# Patient Record
Sex: Male | Born: 1995 | Race: White | Hispanic: No | Marital: Single | Smoking: Never smoker
Health system: Southern US, Community
[De-identification: ages and names within clinical notes are randomized; demographics above are authoritative.]

## PROBLEM LIST (undated history)

## (undated) DIAGNOSIS — F988 Other specified behavioral and emotional disorders with onset usually occurring in childhood and adolescence: Secondary | ICD-10-CM

## (undated) DIAGNOSIS — F419 Anxiety disorder, unspecified: Secondary | ICD-10-CM

## (undated) HISTORY — PX: DENTAL SURGERY: SHX609

## (undated) HISTORY — PX: APPENDECTOMY: SHX54

## (undated) HISTORY — PX: CLEFT LIP REPAIR: SUR1164

---

## 2019-10-11 ENCOUNTER — Encounter (HOSPITAL_COMMUNITY): Payer: Self-pay | Admitting: Emergency Medicine

## 2019-10-11 ENCOUNTER — Emergency Department (HOSPITAL_COMMUNITY)
Admission: EM | Admit: 2019-10-11 | Discharge: 2019-10-11 | Disposition: A | Discharge status: Home / Self Care | Payer: No Typology Code available for payment source | Attending: Emergency Medicine | Admitting: Emergency Medicine

## 2019-10-11 ENCOUNTER — Other Ambulatory Visit: Payer: Self-pay

## 2019-10-11 DIAGNOSIS — T23262A Burn of second degree of back of left hand, initial encounter: Secondary | ICD-10-CM | POA: Diagnosis not present

## 2019-10-11 DIAGNOSIS — Y998 Other external cause status: Secondary | ICD-10-CM | POA: Diagnosis not present

## 2019-10-11 DIAGNOSIS — Y9389 Activity, other specified: Secondary | ICD-10-CM | POA: Diagnosis not present

## 2019-10-11 DIAGNOSIS — Y9289 Other specified places as the place of occurrence of the external cause: Secondary | ICD-10-CM | POA: Diagnosis not present

## 2019-10-11 DIAGNOSIS — X12XXXA Contact with other hot fluids, initial encounter: Secondary | ICD-10-CM | POA: Insufficient documentation

## 2019-10-11 DIAGNOSIS — T23002A Burn of unspecified degree of left hand, unspecified site, initial encounter: Secondary | ICD-10-CM | POA: Diagnosis present

## 2019-10-11 MED ORDER — BACITRACIN ZINC 500 UNIT/GM EX OINT: 1.0000 "application " | TOPICAL_OINTMENT | Freq: Two times a day (BID) | CUTANEOUS | 3 refills | Status: AC

## 2019-10-11 MED ORDER — HYDROCODONE-ACETAMINOPHEN 5-325 MG PO TABS
1.0000 | ORAL_TABLET | Freq: Once | ORAL | Status: AC
  Administered 2019-10-11: 1 via ORAL
  Filled 2019-10-11: qty 1

## 2019-10-11 MED ORDER — HYDROCODONE-ACETAMINOPHEN 5-325 MG PO TABS: 1.0000 | ORAL_TABLET | ORAL | 0 refills | Status: AC | PRN

## 2019-10-11 MED ORDER — BACITRACIN ZINC 500 UNIT/GM EX OINT
TOPICAL_OINTMENT | Freq: Once | CUTANEOUS | Status: AC
  Administered 2019-10-11: via TOPICAL
  Filled 2019-10-11: qty 0.9
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
## 2019-10-11 NOTE — Discharge Instructions (Signed)
Apply bacitracin twice daily to burns as directed. You can take Motrin and Tylenol for your pain as needed as directed.  If Motrin and Tylenol are not enough to control your pain, you may take Norco in place of your next Tylenol dose.  Do not drive or operate machinery if taking Norco.  Norco can cause constipation, take Colace if needed for this.  The verity of a burn will change over the first 24 hours of the injury.  He will need a 24-hour recheck on your hand.  This can be done in the emergency room tomorrow evening or in an urgent care or other Worker's [employer]. facility recommended by your employer.
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
## 2019-10-12 ENCOUNTER — Encounter (HOSPITAL_COMMUNITY): Payer: Self-pay

## 2019-10-12 ENCOUNTER — Emergency Department (HOSPITAL_COMMUNITY)
Admission: EM | Admit: 2019-10-12 | Discharge: 2019-10-12 | Disposition: A | Discharge status: Home / Self Care | Payer: No Typology Code available for payment source | Attending: Emergency Medicine | Admitting: Emergency Medicine

## 2019-10-12 ENCOUNTER — Other Ambulatory Visit: Payer: Self-pay

## 2019-10-12 DIAGNOSIS — X19XXXD Contact with other heat and hot substances, subsequent encounter: Secondary | ICD-10-CM | POA: Insufficient documentation

## 2019-10-12 DIAGNOSIS — T23222D Burn of second degree of single left finger (nail) except thumb, subsequent encounter: Secondary | ICD-10-CM | POA: Insufficient documentation

## 2019-10-12 DIAGNOSIS — T23222A Burn of second degree of single left finger (nail) except thumb, initial encounter: Secondary | ICD-10-CM

## 2019-10-12 NOTE — Discharge Instructions (Signed)
Use Tylenol and ibuprofen as needed for mild to moderate pain.  Use the narcotic pain medicine as needed for severe breakthrough pain.  Have caution, this may make you tired or groggy.  Do not drive or operate heavy machinery while taking this medicine. Change dressing daily.  When reapplying a new dressing, make sure you apply bacitracin/Vaseline to prevent the dressing from sticking.  Your blisters will likely pop on their own-- do not try to pop them.  Follow up with Dr. Marla Roe as instructed.  Return to the ER if you develop fevers, numbness, worsening pain, pus training form the wounds, or any new, worsening, or concerning symptoms.
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
## 2019-10-12 NOTE — ED Provider Notes (Signed)
[facility]-EMERGENCY DEPT Provider Note   CSN: [id number] Arrival date & time: 10/12/19  1519     History   Chief Complaint Chief Complaint  Patient presents with  . Follow-up    HPI James Lamb is a 23 y.o. male presenting for recheck of hand burn.  Patient states he was seen yesterday for a burn to the dorsal aspect of his left hand.  Patient states he has not had significant pain.  He states his fingers feel swollen, but no numbness or tingling.  No fevers or chills.  He has not unwrapped the dressing.  He tried to follow-up with Worker's [employer]., however the office is closed on the weekend.  He is planning on following up with plastic surgery as recommended on Monday.  He is not immunocompromised.  Tetanus is up-to-date.  Additional history obtained from chart review.  Reviewed visit and images from initial evaluation of hand burn.     HPI  History reviewed. No pertinent past medical history.  There are no active problems to display for this patient.   Past Surgical History:  Procedure Laterality Date  . APPENDECTOMY    . CLEFT LIP REPAIR    . DENTAL SURGERY          Home Medications    Prior to Admission medications   Medication Sig Start Date End Date Taking? Authorizing Provider  bacitracin ointment Apply 1 application topically 2 (two) times daily. 10/11/19   Jeannie Fend, PA-C  HYDROcodone-acetaminophen (NORCO/VICODIN) 5-325 MG tablet Take 1 tablet by mouth every 4 (four) hours as needed. 10/11/19   Jeannie Fend, PA-C    Family History History reviewed. No pertinent family history.  Social History Social History   Tobacco Use  . Smoking status: Never Smoker  . Smokeless tobacco: Never Used  Substance Use Topics  . Alcohol use: Never    Frequency: Never  . Drug use: Never     Allergies   Patient has no known allergies.   Review of Systems Review of Systems  Constitutional: Negative for fever.  Skin:   Burn to the left hand  Neurological: Negative for numbness.     Physical Exam Updated Vital Signs BP 139/63 (BP Location: Right Arm)   Pulse 98   Temp 98.6 F (37 C) (Oral)   Resp 18   Ht 5\' 8"  (1.727 m)   Wt 93 kg   SpO2 99%   BMI 31.17 kg/m   Physical Exam Vitals signs and nursing note reviewed.  Constitutional:      General: He is not in acute distress.    Appearance: He is well-developed.     Comments: Appears nontoxic  HENT:     Head: Normocephalic and atraumatic.  Neck:     Musculoskeletal: Normal range of motion.  Pulmonary:     Effort: Pulmonary effort is normal.  Abdominal:     General: There is no distension.  Musculoskeletal: Normal range of motion.     Comments: Good distal sensation and cap refill of all fingers.  Skin:    General: Skin is warm.     Capillary Refill: Capillary refill takes less than 2 seconds.     Findings: No rash.     Comments: See picture below.  Blisters noted on dorsal aspect of multiple fingers of the left hand.  No purulent drainage.  No signs of infection.  Neurological:     Mental Status: He is alert and oriented to person, place, and  time.          ED Treatments / Results  Labs (all labs ordered are listed, but only abnormal results are displayed) Labs Reviewed - No data to display  EKG None  Radiology No results found.  Procedures Procedures (including critical care time)  Medications Ordered in ED Medications - No data to display   Initial Impression / Assessment and Plan / ED Course  I have reviewed the triage vital signs and the nursing notes.  Pertinent labs & imaging results that were available during my care of the patient were reviewed by me and considered in my medical decision making (see chart for details).        Patient presenting for evaluation of recheck of burns.  Physical exam reassuring, he appears nontoxic.  No signs of infection at this time.  Patient without significant pain.  Case  discussed with attending, Dr. Francia Greaves agrees to plan.  Will rewrap with nonstick dressing.  Discussed use of bacitracin to prevent sticking of the dressing.  Discussed follow-up with plastic surgery for further management of the wounds as the skin heals.  Discussed continued monitoring for signs of infection.  At this time, patient appears safe for discharge.  Return precautions given.  Patient states he understands and agrees to plan.  Final Clinical Impressions(s) / ED Diagnoses   Final diagnoses:  Partial thickness burn of finger of left hand, initial encounter    ED Discharge Orders    None       Franchot Heidelberg, PA-C 10/12/19 1641    Valarie Merino, MD 10/12/19 2137

## 2019-10-12 NOTE — ED Notes (Signed)
DSG INTACT TO LEFT HAND. SUPPLIES GIVEN . PT UNDERSTANDS DRSG CHANGE. NO RX GIVEN

## 2020-05-18 ENCOUNTER — Emergency Department (HOSPITAL_BASED_OUTPATIENT_CLINIC_OR_DEPARTMENT_OTHER): Payer: Commercial Managed Care - PPO

## 2020-05-18 ENCOUNTER — Emergency Department (HOSPITAL_BASED_OUTPATIENT_CLINIC_OR_DEPARTMENT_OTHER)
Admission: EM | Admit: 2020-05-18 | Discharge: 2020-05-18 | Disposition: A | Discharge status: Home / Self Care | Payer: Commercial Managed Care - PPO | Attending: Emergency Medicine | Admitting: Emergency Medicine

## 2020-05-18 ENCOUNTER — Other Ambulatory Visit: Payer: Self-pay

## 2020-05-18 ENCOUNTER — Encounter (HOSPITAL_BASED_OUTPATIENT_CLINIC_OR_DEPARTMENT_OTHER): Payer: Self-pay | Admitting: Emergency Medicine

## 2020-05-18 DIAGNOSIS — R1011 Right upper quadrant pain: Secondary | ICD-10-CM | POA: Insufficient documentation

## 2020-05-18 DIAGNOSIS — Z79899 Other long term (current) drug therapy: Secondary | ICD-10-CM | POA: Diagnosis not present

## 2020-05-18 DIAGNOSIS — F909 Attention-deficit hyperactivity disorder, unspecified type: Secondary | ICD-10-CM | POA: Diagnosis not present

## 2020-05-18 HISTORY — DX: Anxiety disorder, unspecified: F41.9

## 2020-05-18 HISTORY — DX: Other specified behavioral and emotional disorders with onset usually occurring in childhood and adolescence: F98.8

## 2020-05-18 LAB — URINALYSIS, ROUTINE W REFLEX MICROSCOPIC
Bilirubin Urine: NEGATIVE
Glucose, UA: NEGATIVE mg/dL
Hgb urine dipstick: NEGATIVE
Ketones, ur: NEGATIVE mg/dL
Leukocytes,Ua: NEGATIVE
Nitrite: NEGATIVE
Protein, ur: NEGATIVE mg/dL
Specific Gravity, Urine: 1.025 (ref 1.005–1.030)
pH: 6 (ref 5.0–8.0)

## 2020-05-18 LAB — CBC WITH DIFFERENTIAL/PLATELET
Abs Immature Granulocytes: 0.06 10*3/uL (ref 0.00–0.07)
Basophils Absolute: 0 10*3/uL (ref 0.0–0.1)
Basophils Relative: 0 %
Eosinophils Absolute: 0.1 10*3/uL (ref 0.0–0.5)
Eosinophils Relative: 1 %
HCT: 43.9 % (ref 39.0–52.0)
Hemoglobin: 14.9 g/dL (ref 13.0–17.0)
Immature Granulocytes: 1 %
Lymphocytes Relative: 11 %
Lymphs Abs: 1.1 10*3/uL (ref 0.7–4.0)
MCH: 28.9 pg (ref 26.0–34.0)
MCHC: 33.9 g/dL (ref 30.0–36.0)
MCV: 85.2 fL (ref 80.0–100.0)
Monocytes Absolute: 0.6 10*3/uL (ref 0.1–1.0)
Monocytes Relative: 6 %
Neutro Abs: 8.4 10*3/uL — ABNORMAL HIGH (ref 1.7–7.7)
Neutrophils Relative %: 81 %
Platelets: 214 10*3/uL (ref 150–400)
RBC: 5.15 MIL/uL (ref 4.22–5.81)
RDW: 12.2 % (ref 11.5–15.5)
WBC: 10.3 10*3/uL (ref 4.0–10.5)
nRBC: 0 % (ref 0.0–0.2)

## 2020-05-18 LAB — COMPREHENSIVE METABOLIC PANEL
ALT: 34 U/L (ref 0–44)
AST: 25 U/L (ref 15–41)
Albumin: 4.5 g/dL (ref 3.5–5.0)
Alkaline Phosphatase: 86 U/L (ref 38–126)
Anion gap: 9 (ref 5–15)
BUN: 11 mg/dL (ref 6–20)
CO2: 26 mmol/L (ref 22–32)
Calcium: 8.9 mg/dL (ref 8.9–10.3)
Chloride: 102 mmol/L (ref 98–111)
Creatinine, Ser: 0.86 mg/dL (ref 0.61–1.24)
GFR calc Af Amer: >60 mL/min (ref >60)
GFR calc non Af Amer: >60 mL/min (ref >60)
Glucose, Bld: 125 mg/dL — ABNORMAL HIGH (ref 70–99)
Potassium: 3.5 mmol/L (ref 3.5–5.1)
Sodium: 137 mmol/L (ref 135–145)
Total Bilirubin: 0.8 mg/dL (ref 0.3–1.2)
Total Protein: 7.4 g/dL (ref 6.5–8.1)

## 2020-05-18 LAB — LIPASE, BLOOD: Lipase: 21 U/L (ref 11–51)

## 2020-05-18 MED ORDER — KETOROLAC TROMETHAMINE 30 MG/ML IJ SOLN
30.0000 mg | Freq: Once | INTRAMUSCULAR | Status: AC
  Administered 2020-05-18: 30 mg via INTRAVENOUS
  Filled 2020-05-18: qty 1

## 2020-05-18 MED ORDER — IBUPROFEN 600 MG PO TABS: 600.0000 mg | ORAL_TABLET | Freq: Four times a day (QID) | ORAL | 0 refills | Status: AC | PRN

## 2020-05-18 MED ORDER — ACETAMINOPHEN 500 MG PO TABS: 500.0000 mg | ORAL_TABLET | Freq: Four times a day (QID) | ORAL | 0 refills | Status: AC | PRN

## 2020-05-18 MED ORDER — IOHEXOL 300 MG/ML  SOLN
100.0000 mL | Freq: Once | INTRAMUSCULAR | Status: AC | PRN
  Administered 2020-05-18: 100 mL via INTRAVENOUS

## 2020-05-18 MED ORDER — MORPHINE SULFATE (PF) 4 MG/ML IV SOLN
4.0000 mg | Freq: Once | INTRAVENOUS | Status: AC
  Administered 2020-05-18: 4 mg via INTRAVENOUS
  Filled 2020-05-18: qty 1

## 2020-05-18 NOTE — ED Notes (Signed)
PA at bedside for IV start/blood draw.

## 2020-05-18 NOTE — ED Provider Notes (Signed)
[facility] EMERGENCY DEPARTMENT Provider Note   CSN: [id number] Arrival date & time: 05/18/20  1618     History Chief Complaint  Patient presents with  . Abdominal Pain    James Lamb is a 24 y.o. male with history of ADD, anxiety presents for evaluation of acute onset, progressively worsening right upper quadrant abdominal pain.  Reports that symptoms began yesterday and were more mild and "felt like a stomachache".  He took Tums and ibuprofen and Tylenol with no change in his symptoms.  When he awoke this morning he reports the pain was persistent and so he took Gas-X with no change in his symptoms.  Approximately 1 to 2 hours ago reports the pain became very severe sharp and stabbing.  He went to urgent care and was sent here for further evaluation and management.  The pain radiates to the back a little.  He denies chest pain, shortness of breath, nausea, vomiting, diarrhea, constipation, or urinary symptoms.  No known sick contacts.  The pain worsens with deep inspiration and sitting upright.  He is status post appendectomy when he was around 24 years old in [place].  He denies recent travel or surgeries, mopped assist, prior history of DVT or PE.  The history is provided by the patient.       Past Medical History:  Diagnosis Date  . ADD (attention deficit disorder)   . Anxiety     There are no problems to display for this patient.   Past Surgical History:  Procedure Laterality Date  . APPENDECTOMY    . CLEFT LIP REPAIR    . DENTAL SURGERY         No family history on file.  Social History   Tobacco Use  . Smoking status: Never Smoker  . Smokeless tobacco: Never Used  Vaping Use  . Vaping Use: Never used  Substance Use Topics  . Alcohol use: Yes  . Drug use: Never    Home Medications Prior to Admission medications   Medication Sig Start Date End Date Taking? Authorizing Provider  guanFACINE (INTUNIV) 1 MG TB24 ER tablet  05/08/20  Yes  [provider]  lisdexamfetamine (VYVANSE) 30 MG capsule Take by mouth. 03/05/20  Yes [provider]  sertraline (ZOLOFT) 25 MG tablet Take by mouth. 03/05/20  Yes [provider]  acetaminophen (TYLENOL) 500 MG tablet Take 1 tablet (500 mg total) by mouth every 6 (six) hours as needed. 05/18/20   Rosanne Wohlfarth A, PA-C  bacitracin ointment Apply 1 application topically 2 (two) times daily. 10/11/19   Tacy Learn, PA-C  HYDROcodone-acetaminophen (NORCO/VICODIN) 5-325 MG tablet Take 1 tablet by mouth every 4 (four) hours as needed. 10/11/19   Tacy Learn, PA-C  ibuprofen (ADVIL) 600 MG tablet Take 1 tablet (600 mg total) by mouth every 6 (six) hours as needed. 05/18/20   Rodell Perna A, PA-C    Allergies    Patient has no known allergies.  Review of Systems   Review of Systems  Constitutional: Negative for chills and fever.  Respiratory: Negative for shortness of breath.   Cardiovascular: Negative for chest pain.  Gastrointestinal: Positive for abdominal pain. Negative for constipation, diarrhea, nausea and vomiting.  Genitourinary: Negative for dysuria, frequency, hematuria and urgency.  All other systems reviewed and are negative.   Physical Exam Updated Vital Signs BP 132/78 (BP Location: Right Arm)   Pulse 80   Temp 98.9 F (37.2 C) (Oral)   Resp  18   Ht 5\' 8"  (1.727 m)   Wt 89.8 kg   SpO2 99%   BMI 30.11 kg/m   Physical Exam Vitals and nursing note reviewed.  Constitutional:      General: He is not in acute distress.    Appearance: He is well-developed.  HENT:     Head: Normocephalic and atraumatic.  Eyes:     General:        Right eye: No discharge.        Left eye: No discharge.     Conjunctiva/sclera: Conjunctivae normal.  Neck:     Vascular: No JVD.     Trachea: No tracheal deviation.  Cardiovascular:     Rate and Rhythm: Normal rate and regular rhythm.  Pulmonary:     Effort: Pulmonary effort is normal.     Breath sounds:  Normal breath sounds.  Chest:     Chest wall: No tenderness.  Abdominal:     General: There is no distension.     Palpations: Abdomen is soft.     Tenderness: There is abdominal tenderness in the right upper quadrant and right lower quadrant. There is no right CVA tenderness, left CVA tenderness, guarding or rebound. Positive signs include Murphy's sign and McBurney's sign. Negative signs include Rovsing's sign, psoas sign and obturator sign.  Skin:    Findings: No erythema.  Neurological:     Mental Status: He is alert.  Psychiatric:        Behavior: Behavior normal.     ED Results / Procedures / Treatments   Labs (all labs ordered are listed, but only abnormal results are displayed) Labs Reviewed  CBC WITH DIFFERENTIAL/PLATELET - Abnormal; Notable for the following components:      Result Value   Neutro Abs 8.4 (*)    All other components within normal limits  COMPREHENSIVE METABOLIC PANEL - Abnormal; Notable for the following components:   Glucose, Bld 125 (*)    All other components within normal limits  LIPASE, BLOOD  URINALYSIS, ROUTINE W REFLEX MICROSCOPIC    EKG None  Radiology CT ABDOMEN PELVIS W CONTRAST  Result Date: 05/18/2020 CLINICAL DATA:  Right upper quadrant pain EXAM: CT ABDOMEN AND PELVIS WITH CONTRAST TECHNIQUE: Multidetector CT imaging of the abdomen and pelvis was performed using the standard protocol following bolus administration of intravenous contrast. CONTRAST:  05/20/2020 OMNIPAQUE IOHEXOL 300 MG/ML  SOLN COMPARISON:  None. FINDINGS: Lower chest: No acute abnormality. Hepatobiliary: No focal liver abnormality is seen. No gallstones, gallbladder wall thickening, or biliary dilatation. Pancreas: Unremarkable. No pancreatic ductal dilatation or surrounding inflammatory changes. Spleen: Normal in size without focal abnormality. Adrenals/Urinary Tract: Adrenal glands are unremarkable. Kidneys are normal, without renal calculi, focal lesion, or hydronephrosis.  Bladder is unremarkable. Stomach/Bowel: Stomach is within normal limits. History of appendectomy. No evidence of bowel wall thickening, distention, or inflammatory changes. Vascular/Lymphatic: No significant vascular findings are present. No enlarged abdominal or pelvic lymph nodes. Reproductive: Prostate is unremarkable. Other: Mild fat stranding within the right upper quadrant mesenteric fat. This is adjacent to the hepatic flexure/distal ascending colon. Musculoskeletal: No acute or significant osseous findings. IMPRESSION: 1. Mild fat stranding within the right upper quadrant mesenteric fat adjacent to the hepatic flexure/distal ascending colon. Findings are nonspecific but consider epiploic appendagitis. 2. Otherwise no CT evidence for acute intra-abdominal or pelvic abnormality. Electronically Signed   By: M.D.   On: 05/18/2020 18:08    Procedures Procedures (including critical care time)  Medications Ordered  in ED Medications  morphine 4 MG/ML injection 4 mg (4 mg Intravenous Given 05/18/20 1710)  iohexol (OMNIPAQUE) 300 MG/ML solution 100 mL (100 mLs Intravenous Contrast Given 05/18/20 1741)  ketorolac (TORADOL) 30 MG/ML injection 30 mg (30 mg Intravenous Given 05/18/20 1837)    ED Course  I have reviewed the triage vital signs and the nursing notes.  Pertinent labs & imaging results that were available during my care of the patient were reviewed by me and considered in my medical decision making (see chart for details).    MDM Rules/Calculators/A&P                          Patient presenting for evaluation of progressively worsening right upper abdominal pain since yesterday.  No other associated symptoms.  He is afebrile, vital signs are stable.  He is nontoxic in appearance.  He is right-sided abdominal pain on examination but no rebound or guarding.  Sent from urgent care for further evaluation.  Lab work reviewed and interpreted by myself shows no leukocytosis, no  anemia, no metabolic derangements or renal insufficiency.  LFTs and lipase are within normal limits.  UA does not show any evidence of UTI or nephrolithiasis.  Imaging today shows mild fat stranding within the right upper quadrant mesenteric fat adjacent to the hepatic flexure/distal ascending colon concerning for possible epiploic appendagitis.  Otherwise no evidence of acute surgical abdominal pathology including obstruction, perforation, cholecystitis, diverticulitis, or nephrolithiasis.  On reevaluation patient is resting comfortably in no apparent distress.  Reports his pain has improved.  Serial abdominal examinations remain benign.  He has been tolerating p.o. food and fluids at home without difficulty.  Discussed findings.  Recommend starting anti-inflammatories, Tylenol, rest.  Discussed signs and symptoms to suggest GI upset from NSAIDs.  Recommend follow-up with PCP for reevaluation of symptoms.  Discussed strict ED return precautions.  Patient and mother verbalized understanding of and agreement with plan and patient is stable for discharge at this time.   Final Clinical Impression(s) / ED Diagnoses Final diagnoses:  Right upper quadrant abdominal pain    Rx / DC Orders ED Discharge Orders         Ordered    ibuprofen (ADVIL) 600 MG tablet  Every 6 hours PRN     Discontinue  Reprint     05/18/20 1851    acetaminophen (TYLENOL) 500 MG tablet  Every 6 hours PRN     Discontinue  Reprint     05/18/20 1851           Jeanie Sewer, PA-C 05/18/20 1855    Vanetta Mulders, MD 05/18/20 1857

## 2020-05-18 NOTE — ED Notes (Signed)
ED Provider at bedside. 

## 2020-05-18 NOTE — ED Triage Notes (Signed)
RLQ abd pain for 2 days, denies n/v/d. Last BM today.

## 2020-05-18 NOTE — Discharge Instructions (Signed)
1. Medications: Alternate 600 mg of ibuprofen and [phone] mg of Tylenol every 3 hours as needed for pain. Do not exceed 4000 mg of Tylenol daily.  Take ibuprofen with food to avoid upset stomach issues.  Stop taking ibuprofen if you develop any upset stomach issues such as vomiting blood, upper abdominal pain mostly on the left side, blood in stool. 2. Treatment: rest, drink plenty of fluids.  3. Follow Up: Please followup with your primary doctor in 3 days for discussion of your diagnoses and further evaluation after today's visit; if you do not have a primary care doctor use the resource guide provided to find one; Please return to the ER for persistent vomiting, high fevers or worsening symptoms

## 2021-11-28 IMAGING — CT CT ABD-PELV W/ CM
2 of 4 series · 16 of 46 positions shown, 18 images · IV contrast (Omnipaque)
Comparison: None.

CLINICAL DATA: Right upper quadrant pain

EXAM:
CT ABDOMEN AND PELVIS WITH CONTRAST
TECHNIQUE: Multidetector CT imaging of the abdomen and pelvis was performed
using the standard protocol following bolus administration of
intravenous contrast.
CONTRAST:  100mL OMNIPAQUE IOHEXOL 300 MG/ML  SOLN

[Series 2: axial st · axial · 0.78mm/px · z∈[-669,-204]mm · 13 of 103 slices shown, 15 images]
[im 5/103  soft-tissue]
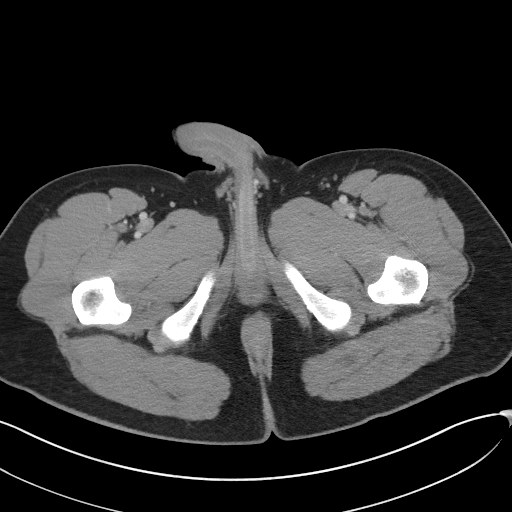
[im 5/103  bone]
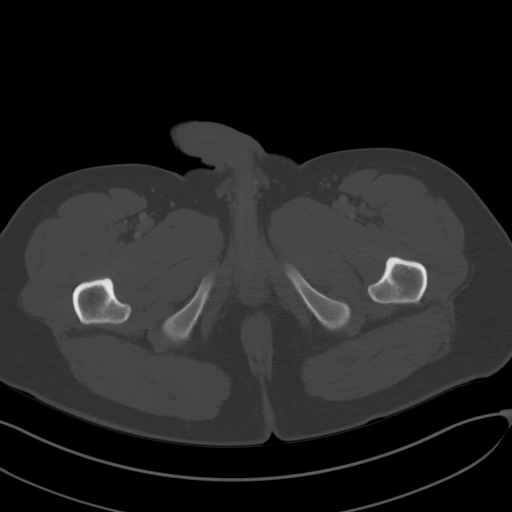
[im 13/103  soft-tissue]
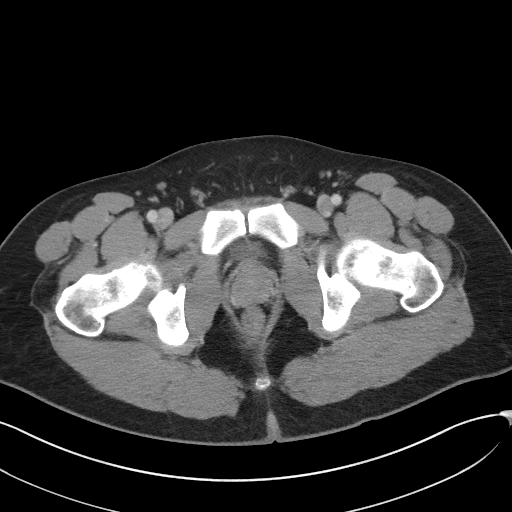
[im 21/103  soft-tissue]
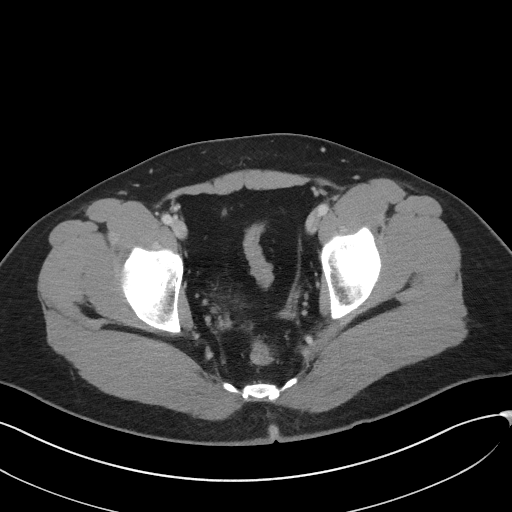
[im 29/103  soft-tissue]
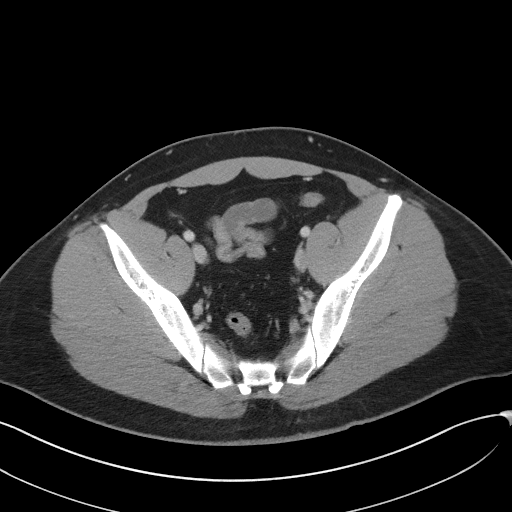
[im 37/103  soft-tissue]
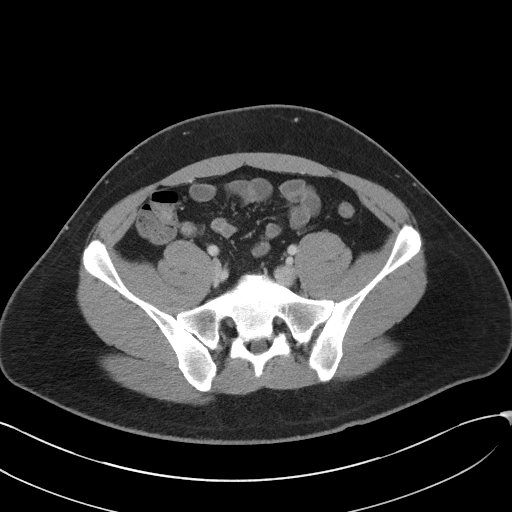
[im 45/103  soft-tissue]
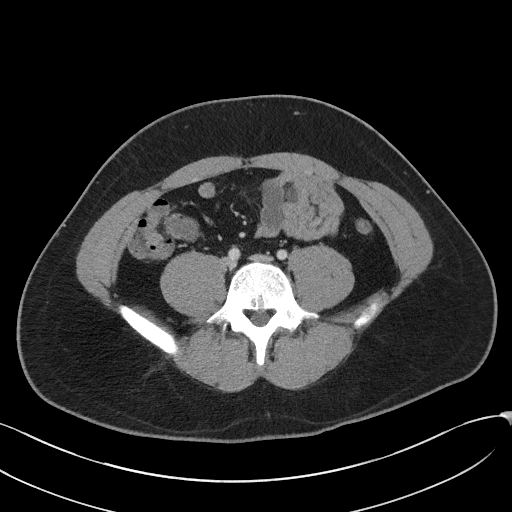
[im 54/103  soft-tissue]
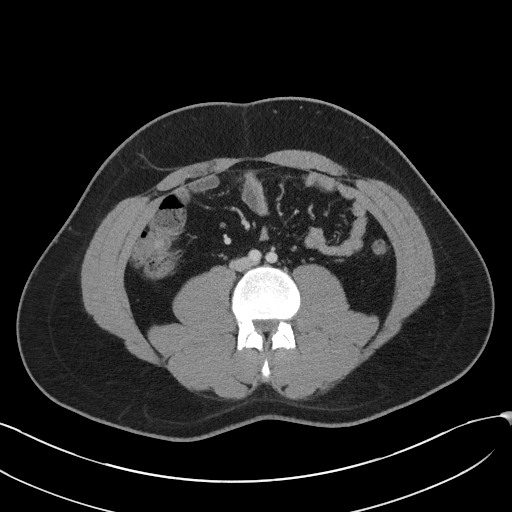
[im 58/103  soft-tissue]
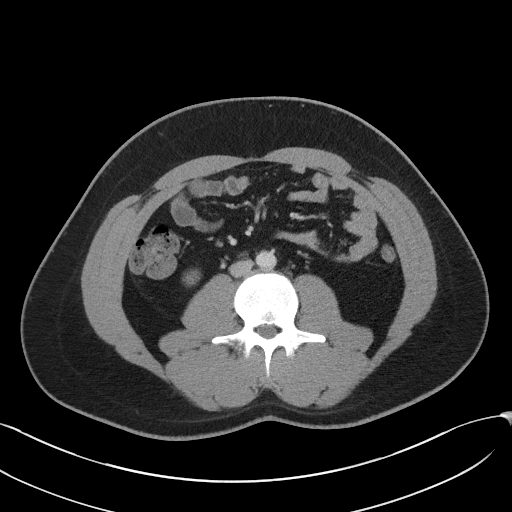
[im 66/103  soft-tissue]
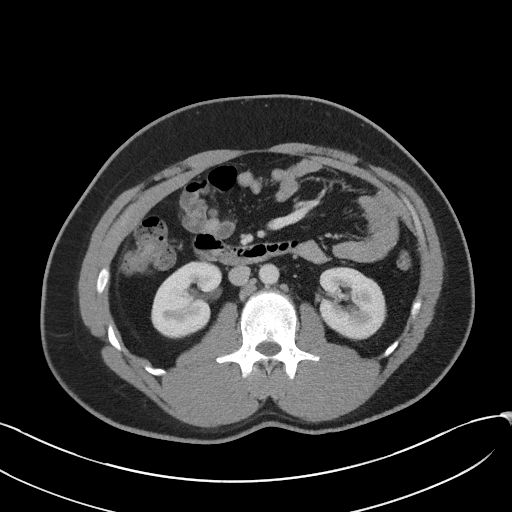
[im 66/103  bone]
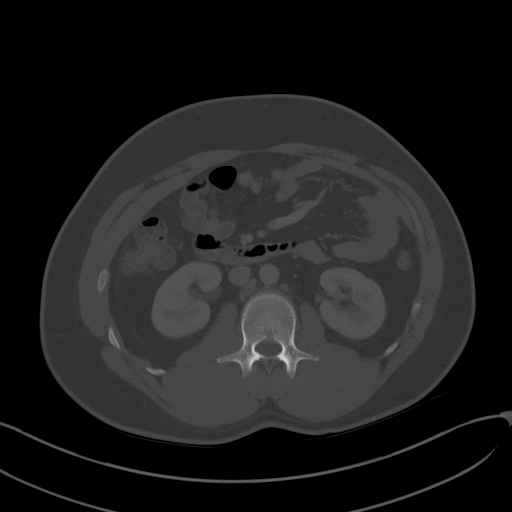
[im 74/103  soft-tissue]
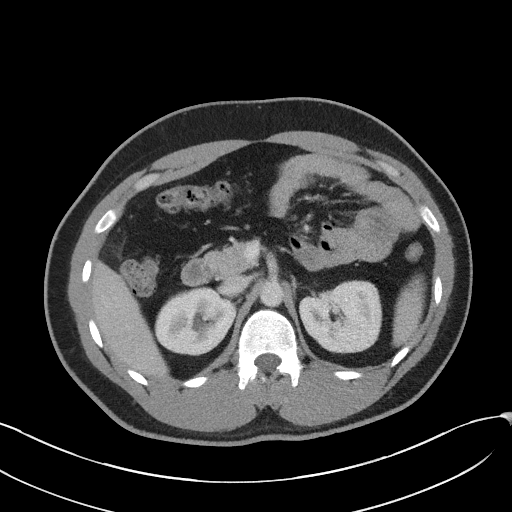
[im 82/103  soft-tissue]
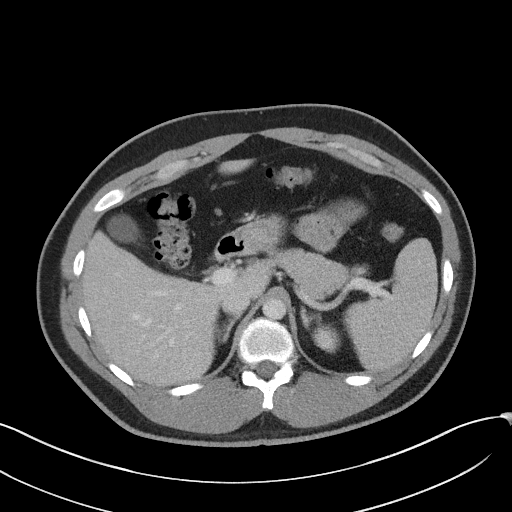
[im 90/103  soft-tissue]
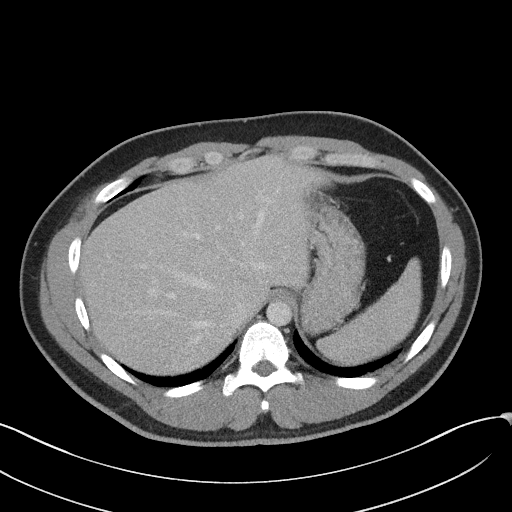
[im 98/103  soft-tissue]
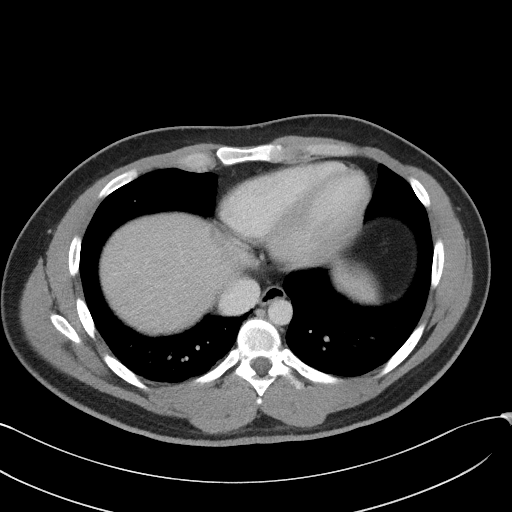

[Series 5: coronal st · coronal · 0.77mm/px · 3 of 101 slices shown]
[im 34/101  soft-tissue]
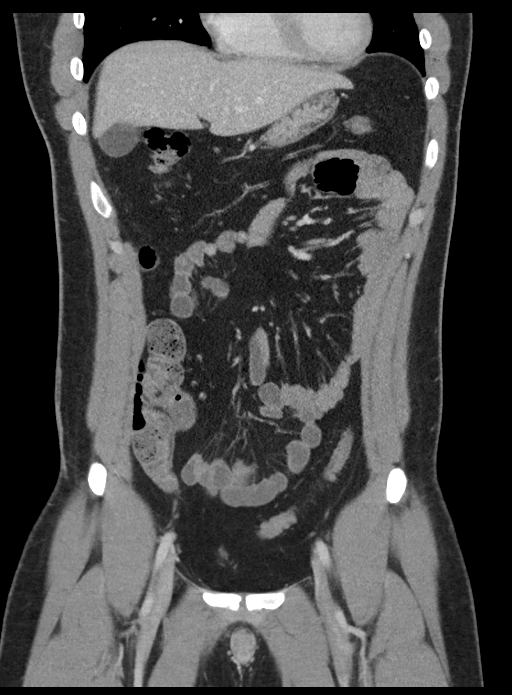
[im 45/101  soft-tissue]
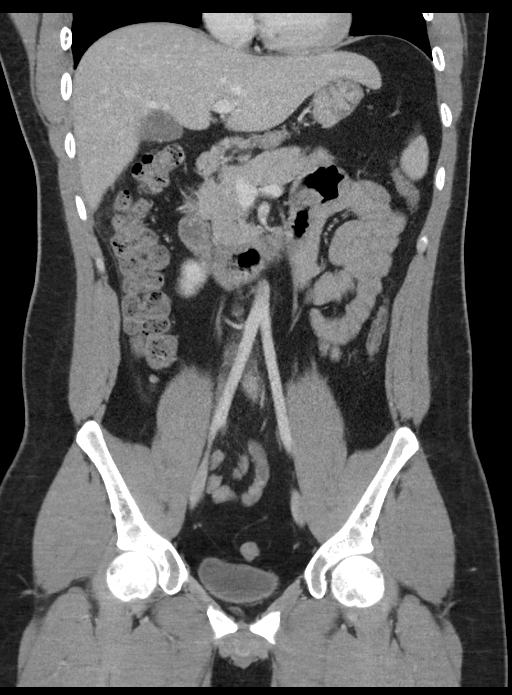
[im 56/101  soft-tissue]
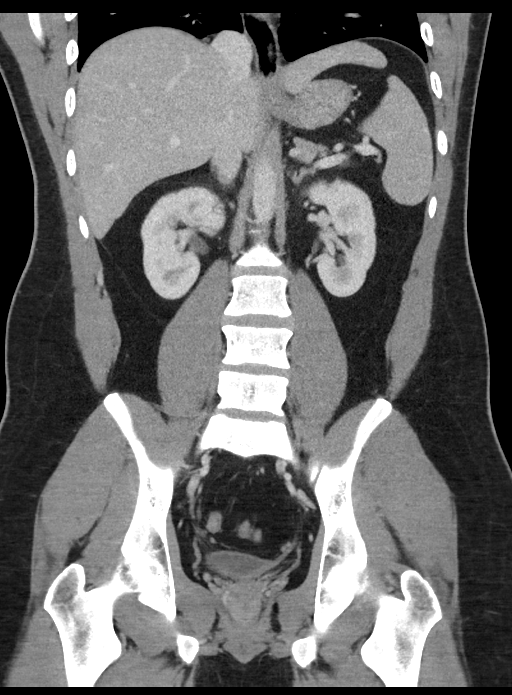

[16 of 46 positions shown; findings below may reference images not displayed]

FINDINGS: Lower chest: No acute abnormality.

Hepatobiliary: No focal liver abnormality is seen. No gallstones,
gallbladder wall thickening, or biliary dilatation.

Pancreas: Unremarkable. No pancreatic ductal dilatation or
surrounding inflammatory changes.

Spleen: Normal in size without focal abnormality.

Adrenals/Urinary Tract: Adrenal glands are unremarkable. Kidneys are
normal, without renal calculi, focal lesion, or hydronephrosis.
Bladder is unremarkable.

Stomach/Bowel: Stomach is within normal limits. History of
appendectomy. No evidence of bowel wall thickening, distention, or
inflammatory changes.

Vascular/Lymphatic: No significant vascular findings are present. No
enlarged abdominal or pelvic lymph nodes.

Reproductive: Prostate is unremarkable.

Other: Mild fat stranding within the right upper quadrant mesenteric
fat. This is adjacent to the hepatic flexure/distal ascending colon.

Musculoskeletal: No acute or significant osseous findings.
IMPRESSION: 1. Mild fat stranding within the right upper quadrant mesenteric fat
adjacent to the hepatic flexure/distal ascending colon. Findings are
nonspecific but consider epiploic appendagitis.
2. Otherwise no CT evidence for acute intra-abdominal or pelvic
abnormality.
